# Patient Record
Sex: Male | Born: 1965
Health system: Southern US, Community
[De-identification: ages and names within clinical notes are randomized; demographics above are authoritative.]

---

## 2018-07-10 DIAGNOSIS — Z20828 Contact with and (suspected) exposure to other viral communicable diseases: Secondary | ICD-10-CM | POA: Diagnosis not present

## 2018-08-26 ENCOUNTER — Emergency Department (HOSPITAL_COMMUNITY)
Admission: EM | Admit: 2018-08-26 | Discharge: 2018-08-26 | Disposition: A | Discharge status: Home / Self Care | Payer: BC Managed Care – PPO | Attending: Emergency Medicine | Admitting: Emergency Medicine

## 2018-08-26 ENCOUNTER — Other Ambulatory Visit: Payer: Self-pay

## 2018-08-26 ENCOUNTER — Encounter (HOSPITAL_COMMUNITY): Payer: Self-pay | Admitting: Emergency Medicine

## 2018-08-26 ENCOUNTER — Emergency Department (HOSPITAL_COMMUNITY): Payer: BC Managed Care – PPO

## 2018-08-26 DIAGNOSIS — N130 Hydronephrosis with ureteropelvic junction obstruction: Secondary | ICD-10-CM | POA: Insufficient documentation

## 2018-08-26 DIAGNOSIS — R112 Nausea with vomiting, unspecified: Secondary | ICD-10-CM | POA: Insufficient documentation

## 2018-08-26 DIAGNOSIS — N132 Hydronephrosis with renal and ureteral calculous obstruction: Secondary | ICD-10-CM | POA: Diagnosis not present

## 2018-08-26 DIAGNOSIS — R1032 Left lower quadrant pain: Secondary | ICD-10-CM | POA: Diagnosis not present

## 2018-08-26 DIAGNOSIS — N201 Calculus of ureter: Secondary | ICD-10-CM

## 2018-08-26 DIAGNOSIS — K760 Fatty (change of) liver, not elsewhere classified: Secondary | ICD-10-CM | POA: Diagnosis not present

## 2018-08-26 DIAGNOSIS — N134 Hydroureter: Secondary | ICD-10-CM | POA: Insufficient documentation

## 2018-08-26 LAB — URINALYSIS, ROUTINE W REFLEX MICROSCOPIC
Bacteria, UA: NONE SEEN
Bilirubin Urine: NEGATIVE
Glucose, UA: NEGATIVE mg/dL
Ketones, ur: 5 mg/dL — AB
Leukocytes,Ua: NEGATIVE
Nitrite: NEGATIVE
Protein, ur: NEGATIVE mg/dL
RBC / HPF: >50 RBC/hpf — ABNORMAL HIGH (ref 0–5)
Specific Gravity, Urine: 1.02 (ref 1.005–1.030)
pH: 5 (ref 5.0–8.0)

## 2018-08-26 LAB — CBC WITH DIFFERENTIAL/PLATELET
Abs Immature Granulocytes: 0.04 10*3/uL (ref 0.00–0.07)
Basophils Absolute: 0.1 10*3/uL (ref 0.0–0.1)
Basophils Relative: 0 %
Eosinophils Absolute: 0 10*3/uL (ref 0.0–0.5)
Eosinophils Relative: 0 %
HCT: 42.4 % (ref 39.0–52.0)
Hemoglobin: 14.3 g/dL (ref 13.0–17.0)
Immature Granulocytes: 0 %
Lymphocytes Relative: 13 %
Lymphs Abs: 1.4 10*3/uL (ref 0.7–4.0)
MCH: 29.6 pg (ref 26.0–34.0)
MCHC: 33.7 g/dL (ref 30.0–36.0)
MCV: 87.8 fL (ref 80.0–100.0)
Monocytes Absolute: 0.6 10*3/uL (ref 0.1–1.0)
Monocytes Relative: 5 %
Neutro Abs: 9.2 10*3/uL — ABNORMAL HIGH (ref 1.7–7.7)
Neutrophils Relative %: 82 %
Platelets: 289 10*3/uL (ref 150–400)
RBC: 4.83 MIL/uL (ref 4.22–5.81)
RDW: 12.3 % (ref 11.5–15.5)
WBC: 11.4 10*3/uL — ABNORMAL HIGH (ref 4.0–10.5)
nRBC: 0 % (ref 0.0–0.2)

## 2018-08-26 LAB — COMPREHENSIVE METABOLIC PANEL
ALT: 40 U/L (ref 0–44)
AST: 30 U/L (ref 15–41)
Albumin: 4.5 g/dL (ref 3.5–5.0)
Alkaline Phosphatase: 48 U/L (ref 38–126)
Anion gap: 16 — ABNORMAL HIGH (ref 5–15)
BUN: 18 mg/dL (ref 6–20)
CO2: 20 mmol/L — ABNORMAL LOW (ref 22–32)
Calcium: 9.5 mg/dL (ref 8.9–10.3)
Chloride: 105 mmol/L (ref 98–111)
Creatinine, Ser: 1.43 mg/dL — ABNORMAL HIGH (ref 0.61–1.24)
GFR calc Af Amer: >60 mL/min (ref >60)
GFR calc non Af Amer: 56 mL/min — ABNORMAL LOW (ref >60)
Glucose, Bld: 135 mg/dL — ABNORMAL HIGH (ref 70–99)
Potassium: 4 mmol/L (ref 3.5–5.1)
Sodium: 141 mmol/L (ref 135–145)
Total Bilirubin: 1 mg/dL (ref 0.3–1.2)
Total Protein: 7.3 g/dL (ref 6.5–8.1)

## 2018-08-26 LAB — LIPASE, BLOOD: Lipase: 19 U/L (ref 11–51)

## 2018-08-26 MED ORDER — TAMSULOSIN HCL 0.4 MG PO CAPS: 0.4000 mg | ORAL_CAPSULE | Freq: Every day | ORAL | 0 refills | Status: AC

## 2018-08-26 MED ORDER — ONDANSETRON HCL 4 MG PO TABS: 4.0000 mg | ORAL_TABLET | Freq: Four times a day (QID) | ORAL | 0 refills | Status: AC

## 2018-08-26 MED ORDER — ONDANSETRON HCL 4 MG/2ML IJ SOLN
4.0000 mg | Freq: Once | INTRAMUSCULAR | Status: AC
  Administered 2018-08-26: 10:00:00 4 mg via INTRAVENOUS
  Filled 2018-08-26: qty 2

## 2018-08-26 MED ORDER — OXYCODONE-ACETAMINOPHEN 5-325 MG PO TABS: 1.0000 | ORAL_TABLET | Freq: Four times a day (QID) | ORAL | 0 refills | Status: AC | PRN

## 2018-08-26 MED ORDER — SODIUM CHLORIDE 0.9 % IV BOLUS
500.0000 mL | Freq: Once | INTRAVENOUS | Status: AC
  Administered 2018-08-26: 500 mL via INTRAVENOUS

## 2018-08-26 MED ORDER — POLYETHYLENE GLYCOL 3350 17 G PO PACK: 17.0000 g | PACK | Freq: Every day | ORAL | 0 refills | Status: AC

## 2018-08-26 MED ORDER — DOCUSATE SODIUM 250 MG PO CAPS: 250.0000 mg | ORAL_CAPSULE | Freq: Every day | ORAL | 0 refills | Status: AC

## 2018-08-26 NOTE — Discharge Instructions (Addendum)
Take Percocet every 6 hours as needed for your pain.  Avoid NSAID medication such as ibuprofen, Aleve, as your kidney function is decreased today (creatinine 1.43).  This may be related to your kidney stone, but could be your baseline.  Please have this rechecked with your doctor in the next few weeks.  Take Colace and MiraLAX as needed for constipation.  Take Flomax after dinner to help pass her stone.  This medication can drop your blood pressure when you stand up, so it is better if you take this before bed.  Make sure to stay well-hydrated, with water especially.  Please return to the emergency department if you develop any new or worsening symptoms.  Please follow-up with the urologist within 1 week for further evaluation and treatment of your kidney stone.  Strain your urine every time and try to catch the stone so that you can bring it to the urologist for evaluation.  Do not drink alcohol, drive, operate machinery or participate in any other potentially dangerous activities while taking opiate pain medication as it may make you sleepy. Do not take this medication with any other sedating medications, either prescription or over-the-counter. If you were prescribed Percocet or Vicodin, do not take these with acetaminophen (Tylenol) as it is already contained within these medications and overdose of Tylenol is dangerous.   This medication is an opiate (or narcotic) pain medication and can be habit forming.  Use it as little as possible to achieve adequate pain control.  Do not use or use it with extreme caution if you have a history of opiate abuse or dependence. This medication is intended for your use only - do not give any to anyone else and keep it in a secure place where nobody else, especially children, have access to it. It will also cause or worsen constipation, so you may want to consider taking an over-the-counter stool softener while you are taking this medication.

## 2018-08-26 NOTE — ED Provider Notes (Signed)
[facility] Provider Note   CSN: [id number] Arrival date & time: 08/26/18  0810    History   Chief Complaint Chief Complaint  Patient presents with   Hematuria    HPI Edward Diaz is a 53 y.o. male who is previously healthy who presents for evaluation of hematuria and left lower quadrant and left flank pain.  Patient reports his pain started around 2 AM.  He believes he may have started having blood in his urine yesterday, however he did not really notice at the time.  He describes a pain in his left lower abdomen that feels like he is severely constipated.  Patient states his last bowel movement was 2 days ago and was normal.  It radiates to his back.  He denies any dysuria, scrotal pain or swelling.  He denies any history of kidney stones.  Patient had episode of chills and vomiting prior to arrival.  He tried to take a laxative, however he vomited that as well.     HPI  History reviewed. No pertinent past medical history.  There are no active problems to display for this patient.   History reviewed. No pertinent surgical history.      Home Medications    Prior to Admission medications   Medication Sig Start Date End Date Taking? Authorizing Provider  docusate sodium (COLACE) 250 MG capsule Take 1 capsule (250 mg total) by mouth daily. 08/26/18   Toshiye Kever, Bea Graff, PA-C  ondansetron (ZOFRAN) 4 MG tablet Take 1 tablet (4 mg total) by mouth every 6 (six) hours. 08/26/18   Frederica Kuster, PA-C  oxyCODONE-acetaminophen (PERCOCET/ROXICET) 5-325 MG tablet Take 1-2 tablets by mouth every 6 (six) hours as needed for severe pain. 08/26/18   Sewell Pitner, Bea Graff, PA-C  polyethylene glycol (MIRALAX) 17 g packet Take 17 g by mouth daily. 08/26/18   Wava Kildow, Bea Graff, PA-C  tamsulosin (FLOMAX) 0.4 MG CAPS capsule Take 1 capsule (0.4 mg total) by mouth daily after supper. 08/26/18   Frederica Kuster, PA-C    Family History No family history on  file.  Social History Social History   Tobacco Use   Smoking status: Not on file  Substance Use Topics   Alcohol use: Not on file   Drug use: Not on file     Allergies   Patient has no known allergies.   Review of Systems Review of Systems  Constitutional: Positive for chills. Negative for fever.  HENT: Negative for facial swelling and sore throat.   Respiratory: Negative for shortness of breath.   Cardiovascular: Negative for chest pain.  Gastrointestinal: Positive for abdominal pain, constipation, nausea and vomiting. Negative for blood in stool.  Genitourinary: Positive for flank pain and hematuria. Negative for dysuria, frequency, penile pain, penile swelling, scrotal swelling and testicular pain.  Musculoskeletal: Positive for back pain.  Skin: Negative for rash and wound.  Neurological: Negative for headaches.  Psychiatric/Behavioral: The patient is not nervous/anxious.      Physical Exam Updated Vital Signs BP (!) 133/93 (BP Location: Right Arm)    Pulse 60    Temp 97.6 F (36.4 C) (Oral)    Resp 16    SpO2 100%   Physical Exam Vitals signs and nursing note reviewed.  Constitutional:      General: He is not in acute distress.    Appearance: He is well-developed. He is not diaphoretic.  HENT:     Head: Normocephalic and atraumatic.  Eyes:  General: No scleral icterus.       Right eye: No discharge.        Left eye: No discharge.     Conjunctiva/sclera: Conjunctivae normal.     Pupils: Pupils are equal, round, and reactive to light.  Neck:     Musculoskeletal: Normal range of motion and neck supple.     Thyroid: No thyromegaly.  Cardiovascular:     Rate and Rhythm: Normal rate and regular rhythm.     Heart sounds: Normal heart sounds. No murmur. No friction rub. No gallop.   Pulmonary:     Effort: Pulmonary effort is normal. No respiratory distress.     Breath sounds: Normal breath sounds. No stridor. No wheezing or rales.  Abdominal:      General: Bowel sounds are normal. There is no distension.     Palpations: Abdomen is soft.     Tenderness: There is abdominal tenderness in the left lower quadrant. There is no right CVA tenderness, left CVA tenderness, guarding or rebound.    Lymphadenopathy:     Cervical: No cervical adenopathy.  Skin:    General: Skin is warm and dry.     Coloration: Skin is not pale.     Findings: No rash.  Neurological:     Mental Status: He is alert.     Coordination: Coordination normal.      ED Treatments / Results  Labs (all labs ordered are listed, but only abnormal results are displayed) Labs Reviewed  URINALYSIS, ROUTINE W REFLEX MICROSCOPIC - Abnormal; Notable for the following components:      Result Value   APPearance CLOUDY (*)    Hgb urine dipstick LARGE (*)    Ketones, ur 5 (*)    RBC / HPF >50 (*)    All other components within normal limits  COMPREHENSIVE METABOLIC PANEL - Abnormal; Notable for the following components:   CO2 20 (*)    Glucose, Bld 135 (*)    Creatinine, Ser 1.43 (*)    GFR calc non Af Amer 56 (*)    Anion gap 16 (*)    All other components within normal limits  CBC WITH DIFFERENTIAL/PLATELET - Abnormal; Notable for the following components:   WBC 11.4 (*)    Neutro Abs 9.2 (*)    All other components within normal limits  LIPASE, BLOOD    EKG None  Radiology Ct Renal Stone Study  Result Date: 08/26/2018 CLINICAL DATA:  53 year old male with acute LEFT flank, abdominal and pelvic pain with hematuria. EXAM: CT ABDOMEN AND PELVIS WITHOUT CONTRAST TECHNIQUE: Multidetector CT imaging of the abdomen and pelvis was performed following the standard protocol without IV contrast. COMPARISON:  None. FINDINGS: Please note that parenchymal abnormalities may be missed without intravenous contrast. Lower chest: A 5 mm RIGHT middle lobe nodule is noted (series 5: Image 21). Hepatobiliary: Hepatic steatosis identified without focal hepatic abnormality. The  gallbladder is unremarkable. No biliary dilatation. Pancreas: Unremarkable Spleen: Unremarkable Adrenals/Urinary Tract: A 4 mm distal LEFT ureteral calculus (1.5 cm above the LEFT UVJ) causes mild to moderate LEFT hydroureteronephrosis. A probable RIGHT renal cyst is present. The adrenal glands and bladder are unremarkable. Stomach/Bowel: Stomach is within normal limits. Appendix appears normal. No evidence of bowel wall thickening, distention, or inflammatory changes. Vascular/Lymphatic: Aortic atherosclerosis. No enlarged abdominal or pelvic lymph nodes. Reproductive: Prostate is UPPER limits of normal in size. Other: No ascites, focal collection or pneumoperitoneum. Musculoskeletal: No acute or suspicious bony abnormalities. IMPRESSION: 1. 4  mm distal LEFT ureteral calculus causing mild to moderate LEFT hydroureteronephrosis. 2. 5 mm RIGHT middle lobe nodule. No follow-up needed if patient is low-risk. Non-contrast chest CT can be considered in 12 months if patient is high-risk. This recommendation follows the consensus statement: Guidelines for Management of Incidental Pulmonary Nodules Detected on CT Images: From the Fleischner Society 2017; Radiology 2017; 284:228-243. 3. Hepatic steatosis 4.  Aortic Atherosclerosis (ICD10-I70.0). Electronically Signed   By: Harmon PierJeffrey  Hu M.D.   On: 08/26/2018 09:57    Procedures Procedures (including critical care time)  Medications Ordered in ED Medications  ondansetron (ZOFRAN) injection 4 mg (4 mg Intravenous Given 08/26/18 0950)  sodium chloride 0.9 % bolus 500 mL (500 mLs Intravenous New Bag/Given 08/26/18 0953)     Initial Impression / Assessment and Plan / ED Course  I have reviewed the triage vital signs and the nursing notes.  Pertinent labs & imaging results that were available during my care of the patient were reviewed by me and considered in my medical decision making (see chart for details).        Patient presenting with hematuria and left  lower quadrant pain that began early this morning.  Patient found to have a 4 mm left ureteral stone just above the UVJ with mild to moderate hydro-ureter nephrosis.  His creatinine is 1.43, however I have nothing to compare.  This could be his baseline.  Anion gap 16.  Patient given IV fluids in the ED.  There is a lung nodule seen on CT as well, however patient is a never smoker and so he falls in the low risk category so radiologist advised no follow-up indicated.  Patient advised to follow-up with his PCP for recheck of his kidney function and monitor hepatic steatosis seen on CT.  I also advised follow-up to urology.  He is advised to strain his urine considering no history of kidney stones and bring the stone to the visit.  Will discharge home with Percocet, Flomax, Colace, MiraLAX, and Zofran.  Good hydration discussed.  Return precautions discussed.  Patient understands and agrees with plan.  Patient vital stable throughout ED course and discharged in satisfactory condition.  Final Clinical Impressions(s) / ED Diagnoses   Final diagnoses:  Ureterolithiasis    ED Discharge Orders         Ordered    oxyCODONE-acetaminophen (PERCOCET/ROXICET) 5-325 MG tablet  Every 6 hours PRN     08/26/18 1053    ondansetron (ZOFRAN) 4 MG tablet  Every 6 hours     08/26/18 1053    tamsulosin (FLOMAX) 0.4 MG CAPS capsule  Daily after supper     08/26/18 1053    docusate sodium (COLACE) 250 MG capsule  Daily     08/26/18 1053    polyethylene glycol (MIRALAX) 17 g packet  Daily     08/26/18 [address]1053           Traniya Prichett M, PA-C 08/26/18 1113    Cathren LaineSteinl, Kevin, MD 08/26/18 1302

## 2018-08-26 NOTE — ED Triage Notes (Signed)
Pt here for eval of left lower/mid quadrant abdominal pain radiating to the left flank. Hematuria to urine yesterday. Pt also reports emesis today.

## 2018-08-28 DIAGNOSIS — R1032 Left lower quadrant pain: Secondary | ICD-10-CM | POA: Diagnosis not present

## 2018-08-28 DIAGNOSIS — M2550 Pain in unspecified joint: Secondary | ICD-10-CM | POA: Diagnosis not present

## 2018-08-28 DIAGNOSIS — M1009 Idiopathic gout, multiple sites: Secondary | ICD-10-CM | POA: Diagnosis not present

## 2018-08-28 DIAGNOSIS — Z79899 Other long term (current) drug therapy: Secondary | ICD-10-CM | POA: Diagnosis not present

## 2018-08-28 DIAGNOSIS — Z9119 Patient's noncompliance with other medical treatment and regimen: Secondary | ICD-10-CM | POA: Diagnosis not present

## 2018-11-09 DIAGNOSIS — M2550 Pain in unspecified joint: Secondary | ICD-10-CM | POA: Diagnosis not present

## 2018-11-09 DIAGNOSIS — M1009 Idiopathic gout, multiple sites: Secondary | ICD-10-CM | POA: Diagnosis not present

## 2019-01-11 DIAGNOSIS — Z79899 Other long term (current) drug therapy: Secondary | ICD-10-CM | POA: Diagnosis not present

## 2019-01-11 DIAGNOSIS — Z9119 Patient's noncompliance with other medical treatment and regimen: Secondary | ICD-10-CM | POA: Diagnosis not present

## 2019-01-11 DIAGNOSIS — M2550 Pain in unspecified joint: Secondary | ICD-10-CM | POA: Diagnosis not present

## 2019-01-11 DIAGNOSIS — M1009 Idiopathic gout, multiple sites: Secondary | ICD-10-CM | POA: Diagnosis not present

## 2019-01-11 DIAGNOSIS — Z23 Encounter for immunization: Secondary | ICD-10-CM | POA: Diagnosis not present

## 2019-02-28 DIAGNOSIS — Z9119 Patient's noncompliance with other medical treatment and regimen: Secondary | ICD-10-CM | POA: Diagnosis not present

## 2019-02-28 DIAGNOSIS — M1009 Idiopathic gout, multiple sites: Secondary | ICD-10-CM | POA: Diagnosis not present

## 2019-02-28 DIAGNOSIS — M2550 Pain in unspecified joint: Secondary | ICD-10-CM | POA: Diagnosis not present

## 2019-02-28 DIAGNOSIS — Z79899 Other long term (current) drug therapy: Secondary | ICD-10-CM | POA: Diagnosis not present

## 2019-03-14 DIAGNOSIS — M1009 Idiopathic gout, multiple sites: Secondary | ICD-10-CM | POA: Diagnosis not present

## 2019-05-22 DIAGNOSIS — M1009 Idiopathic gout, multiple sites: Secondary | ICD-10-CM | POA: Diagnosis not present

## 2019-05-22 DIAGNOSIS — M2550 Pain in unspecified joint: Secondary | ICD-10-CM | POA: Diagnosis not present

## 2019-05-22 DIAGNOSIS — Z9119 Patient's noncompliance with other medical treatment and regimen: Secondary | ICD-10-CM | POA: Diagnosis not present

## 2019-05-22 DIAGNOSIS — Z79899 Other long term (current) drug therapy: Secondary | ICD-10-CM | POA: Diagnosis not present

## 2019-08-22 DIAGNOSIS — Z9119 Patient's noncompliance with other medical treatment and regimen: Secondary | ICD-10-CM | POA: Diagnosis not present

## 2019-08-22 DIAGNOSIS — Z79899 Other long term (current) drug therapy: Secondary | ICD-10-CM | POA: Diagnosis not present

## 2019-08-22 DIAGNOSIS — M1009 Idiopathic gout, multiple sites: Secondary | ICD-10-CM | POA: Diagnosis not present

## 2019-08-22 DIAGNOSIS — M2550 Pain in unspecified joint: Secondary | ICD-10-CM | POA: Diagnosis not present

## 2019-09-05 DIAGNOSIS — Z20822 Contact with and (suspected) exposure to covid-19: Secondary | ICD-10-CM | POA: Diagnosis not present

## 2019-11-22 DIAGNOSIS — M1009 Idiopathic gout, multiple sites: Secondary | ICD-10-CM | POA: Diagnosis not present

## 2019-11-22 DIAGNOSIS — Z79899 Other long term (current) drug therapy: Secondary | ICD-10-CM | POA: Diagnosis not present

## 2019-11-22 DIAGNOSIS — M2550 Pain in unspecified joint: Secondary | ICD-10-CM | POA: Diagnosis not present

## 2019-11-22 DIAGNOSIS — Z9119 Patient's noncompliance with other medical treatment and regimen: Secondary | ICD-10-CM | POA: Diagnosis not present

## 2020-01-16 DIAGNOSIS — Z125 Encounter for screening for malignant neoplasm of prostate: Secondary | ICD-10-CM | POA: Diagnosis not present

## 2020-01-16 DIAGNOSIS — Z0000 Encounter for general adult medical examination without abnormal findings: Secondary | ICD-10-CM | POA: Diagnosis not present

## 2020-01-16 DIAGNOSIS — Z13220 Encounter for screening for lipoid disorders: Secondary | ICD-10-CM | POA: Diagnosis not present

## 2020-11-17 DIAGNOSIS — M1009 Idiopathic gout, multiple sites: Secondary | ICD-10-CM | POA: Diagnosis not present

## 2021-05-08 IMAGING — CT CT RENAL STONE PROTOCOL
2 of 4 series · 16 of 46 positions shown, 18 images · non-contrast
Comparison: None.

CLINICAL DATA: 53-year-old male with acute LEFT flank, abdominal
and pelvic pain with hematuria.

EXAM:
CT ABDOMEN AND PELVIS WITHOUT CONTRAST
TECHNIQUE: Multidetector CT imaging of the abdomen and pelvis was performed
following the standard protocol without IV contrast.

[Series 3: ap without · axial · non-contrast · 0.72mm/px · z∈[+961,+1406]mm · 13 of 101 slices shown, 15 images]
[im 6/101  soft-tissue]
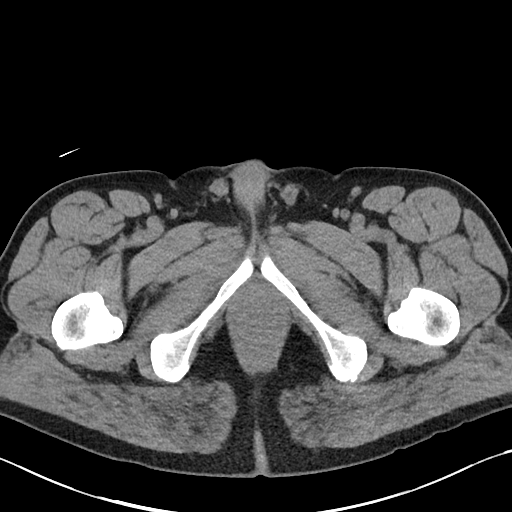
[im 6/101  bone]
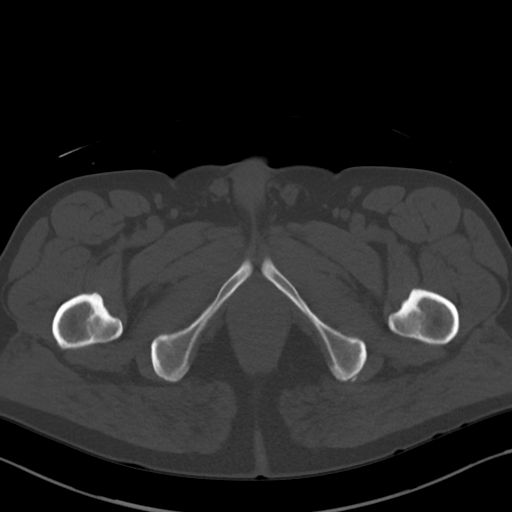
[im 12/101  soft-tissue]
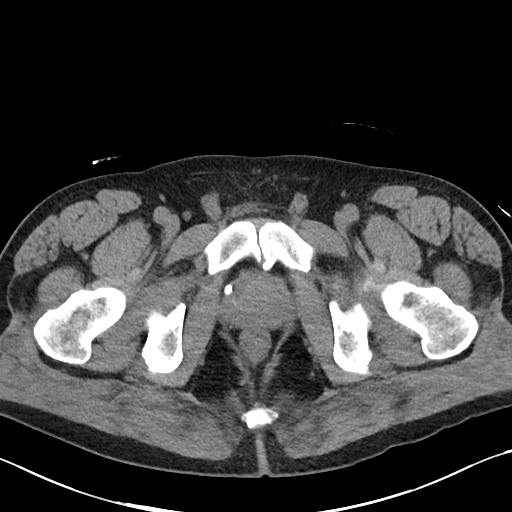
[im 24/101  soft-tissue]
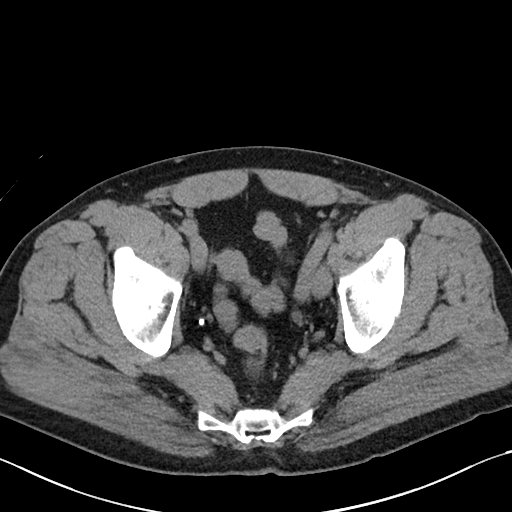
[im 30/101  soft-tissue]
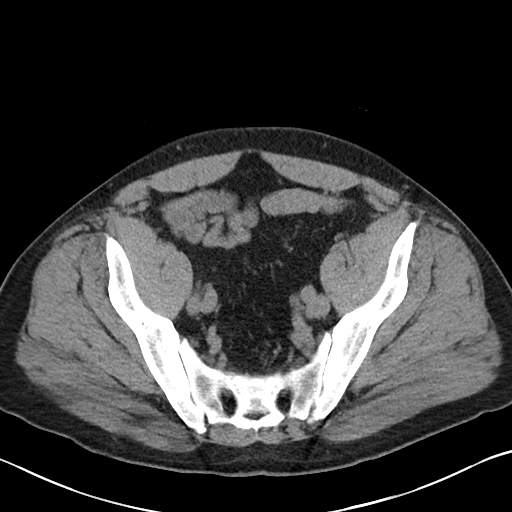
[im 36/101  soft-tissue]
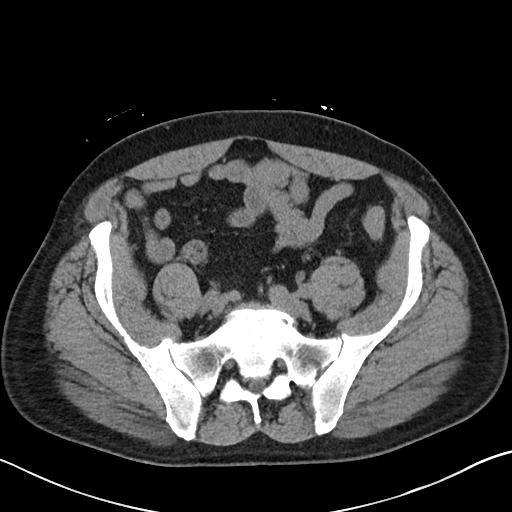
[im 42/101  soft-tissue]
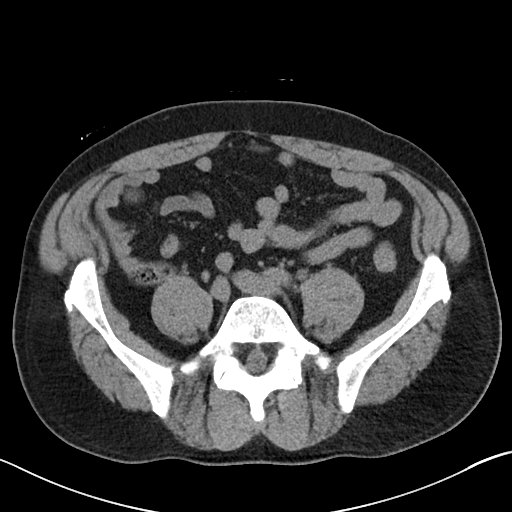
[im 53/101  soft-tissue]
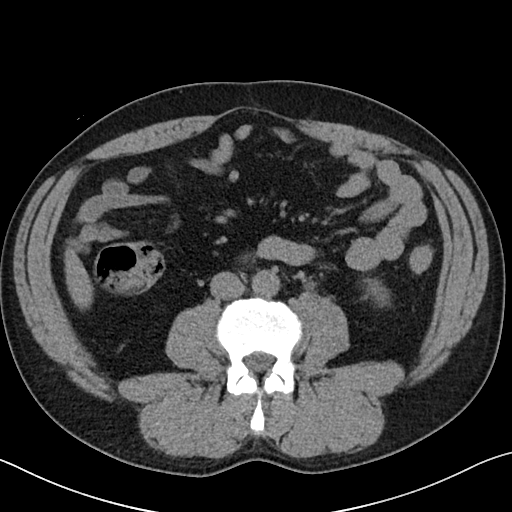
[im 59/101  soft-tissue]
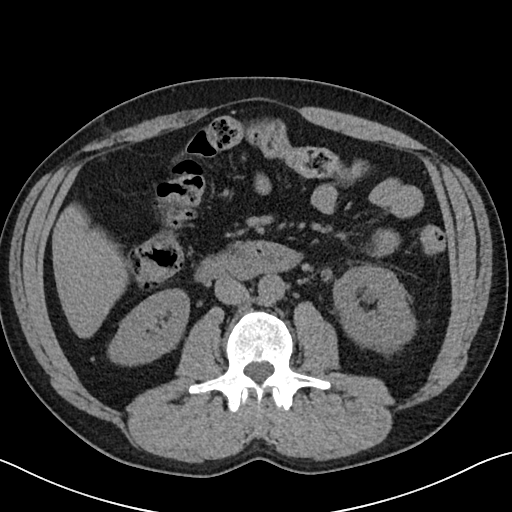
[im 65/101  soft-tissue]
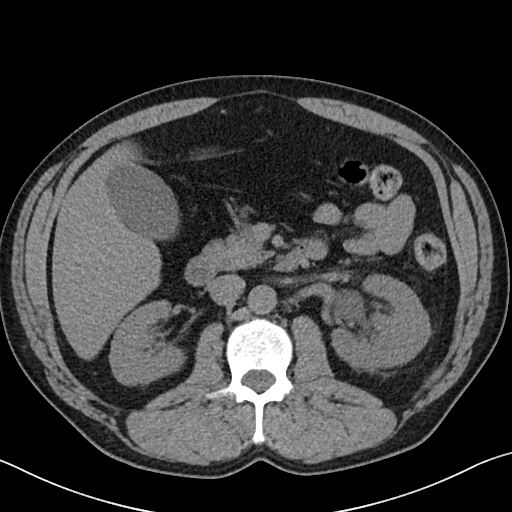
[im 65/101  bone]
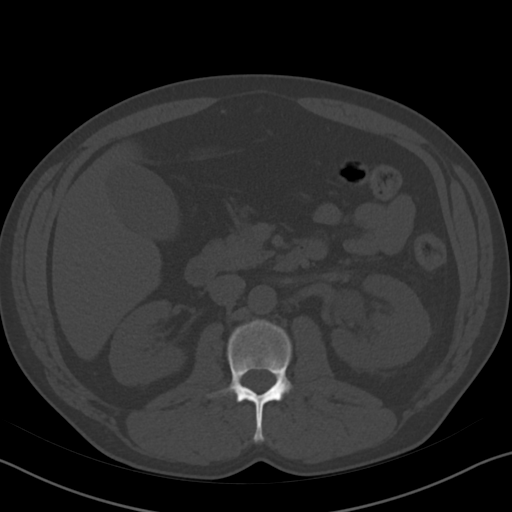
[im 71/101  soft-tissue]
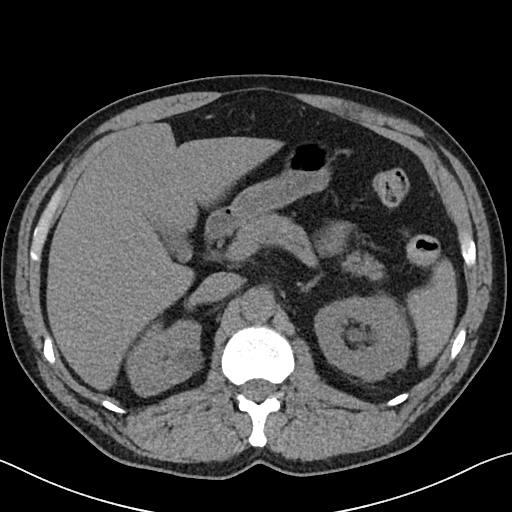
[im 77/101  soft-tissue]
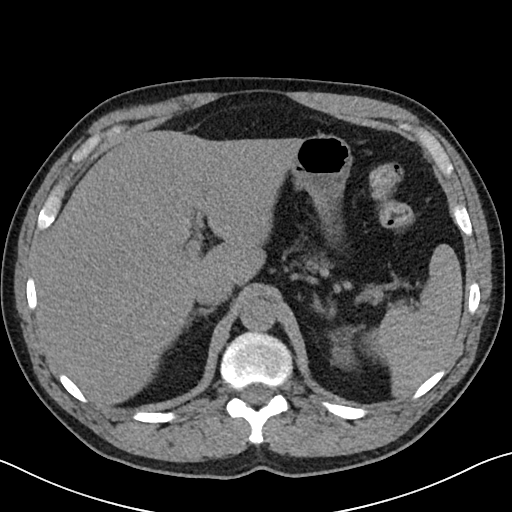
[im 89/101  soft-tissue]
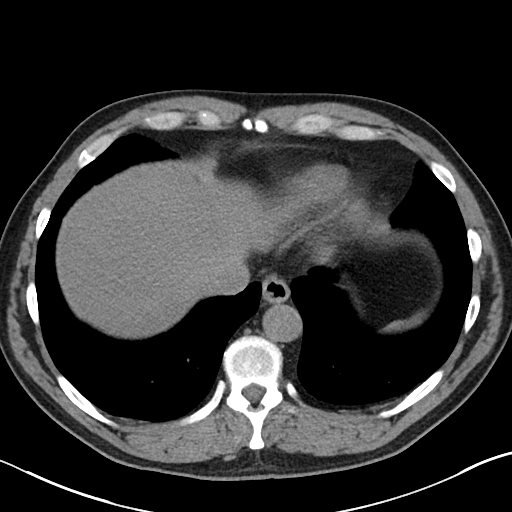
[im 95/101  soft-tissue]
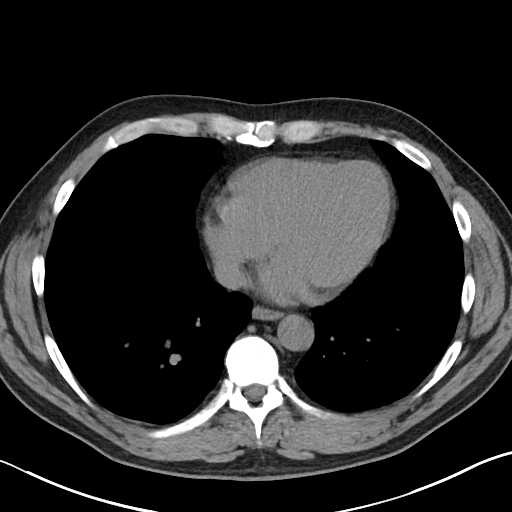

[Series 6: cor · coronal · 0.85mm/px · 3 of 101 slices shown]
[im 34/101  soft-tissue]
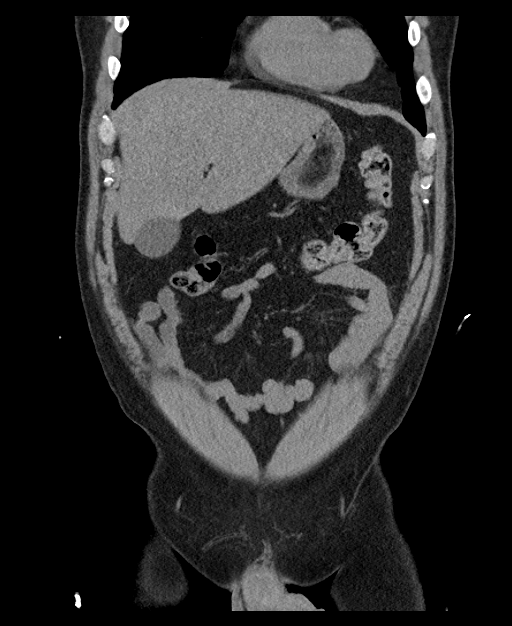
[im 45/101  soft-tissue]
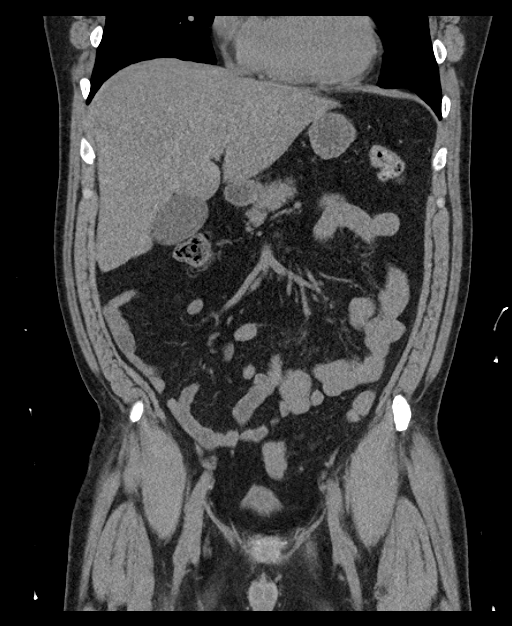
[im 56/101  soft-tissue]
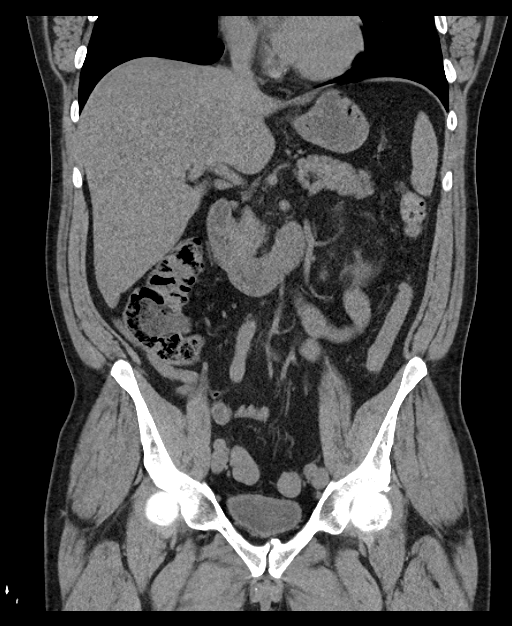

[16 of 46 positions shown; findings below may reference images not displayed]

FINDINGS: Please note that parenchymal abnormalities may be missed without
intravenous contrast.

Lower chest: A 5 mm RIGHT middle lobe nodule is noted (series 5:
Image 21).

Hepatobiliary: Hepatic steatosis identified without focal hepatic
abnormality. The gallbladder is unremarkable. No biliary dilatation.

Pancreas: Unremarkable

Spleen: Unremarkable

Adrenals/Urinary Tract: A 4 mm distal LEFT ureteral calculus (1.5 cm
above the LEFT UVJ) causes mild to moderate LEFT
hydroureteronephrosis.

A probable RIGHT renal cyst is present. The adrenal glands and
bladder are unremarkable.

Stomach/Bowel: Stomach is within normal limits. Appendix appears
normal. No evidence of bowel wall thickening, distention, or
inflammatory changes.

Vascular/Lymphatic: Aortic atherosclerosis. No enlarged abdominal or
pelvic lymph nodes.

Reproductive: Prostate is UPPER limits of normal in size.

Other: No ascites, focal collection or pneumoperitoneum.

Musculoskeletal: No acute or suspicious bony abnormalities.
IMPRESSION: 1. 4 mm distal LEFT ureteral calculus causing mild to moderate LEFT
hydroureteronephrosis.
2. 5 mm RIGHT middle lobe nodule. No follow-up needed if patient is
low-risk. Non-contrast chest CT can be considered in 12 months if
patient is high-risk. This recommendation follows the consensus
statement: Guidelines for Management of Incidental Pulmonary Nodules
Detected on CT Images: From the [HOSPITAL] 1354; Radiology
3. Hepatic steatosis
4.  Aortic Atherosclerosis (CY5XT-CT4.4).

## 2021-05-17 DIAGNOSIS — M1009 Idiopathic gout, multiple sites: Secondary | ICD-10-CM | POA: Diagnosis not present

## 2021-05-17 DIAGNOSIS — Z79899 Other long term (current) drug therapy: Secondary | ICD-10-CM | POA: Diagnosis not present

## 2021-06-17 DIAGNOSIS — Z79899 Other long term (current) drug therapy: Secondary | ICD-10-CM | POA: Diagnosis not present

## 2021-06-17 DIAGNOSIS — M1009 Idiopathic gout, multiple sites: Secondary | ICD-10-CM | POA: Diagnosis not present

## 2022-01-26 DIAGNOSIS — M1009 Idiopathic gout, multiple sites: Secondary | ICD-10-CM | POA: Diagnosis not present

## 2022-01-26 DIAGNOSIS — Z79899 Other long term (current) drug therapy: Secondary | ICD-10-CM | POA: Diagnosis not present

## 2022-06-10 DIAGNOSIS — M109 Gout, unspecified: Secondary | ICD-10-CM | POA: Diagnosis not present

## 2022-06-10 DIAGNOSIS — Z833 Family history of diabetes mellitus: Secondary | ICD-10-CM | POA: Diagnosis not present

## 2022-06-10 DIAGNOSIS — I10 Essential (primary) hypertension: Secondary | ICD-10-CM | POA: Diagnosis not present

## 2022-06-10 DIAGNOSIS — Z1389 Encounter for screening for other disorder: Secondary | ICD-10-CM | POA: Diagnosis not present

## 2022-06-10 DIAGNOSIS — Z6830 Body mass index (BMI) 30.0-30.9, adult: Secondary | ICD-10-CM | POA: Diagnosis not present

## 2022-06-10 DIAGNOSIS — E669 Obesity, unspecified: Secondary | ICD-10-CM | POA: Diagnosis not present

## 2022-06-10 DIAGNOSIS — Z0001 Encounter for general adult medical examination with abnormal findings: Secondary | ICD-10-CM | POA: Diagnosis not present

## 2022-07-21 DIAGNOSIS — M1009 Idiopathic gout, multiple sites: Secondary | ICD-10-CM | POA: Diagnosis not present

## 2022-07-21 DIAGNOSIS — Z79899 Other long term (current) drug therapy: Secondary | ICD-10-CM | POA: Diagnosis not present

## 2023-01-17 DIAGNOSIS — M1009 Idiopathic gout, multiple sites: Secondary | ICD-10-CM | POA: Diagnosis not present

## 2023-01-17 DIAGNOSIS — Z79899 Other long term (current) drug therapy: Secondary | ICD-10-CM | POA: Diagnosis not present
